# Patient Record
Sex: Male | Born: 2005
Health system: Southern US, Community
[De-identification: ages and names within clinical notes are randomized; demographics above are authoritative.]

---
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
## 2023-01-11 ENCOUNTER — Encounter: Payer: Self-pay | Admitting: *Deleted
